# Patient Record
Sex: Male | Born: 1990 | Race: White | Hispanic: No | Marital: Single | Smoking: Never smoker
Health system: Southern US, Community
[De-identification: ages and names within clinical notes are randomized; demographics above are authoritative.]

## PROBLEM LIST (undated history)

## (undated) DIAGNOSIS — E785 Hyperlipidemia, unspecified: Secondary | ICD-10-CM

## (undated) DIAGNOSIS — L709 Acne, unspecified: Secondary | ICD-10-CM

## (undated) HISTORY — PX: TYMPANOSTOMY TUBE PLACEMENT: SHX32

## (undated) HISTORY — DX: Acne, unspecified: L70.9

## (undated) HISTORY — PX: TONSILLECTOMY AND ADENOIDECTOMY: SUR1326

---
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
## 2012-02-07 ENCOUNTER — Telehealth: Payer: Self-pay | Admitting: Sports Medicine

## 2012-02-07 DIAGNOSIS — E785 Hyperlipidemia, unspecified: Secondary | ICD-10-CM

## 2012-02-07 NOTE — Telephone Encounter (Signed)
That sounds fantastic. I will go ahead and put in orders for the future blood work. He needs to be fasting for this blood work

## 2012-02-07 NOTE — Telephone Encounter (Signed)
Patient's father called advised that son is in college and only can have appointments on Fridays when he comes home to visit. Father request for patient to have blood work done Friday Nov 1st and he has appointment scheduled Friday Nov 8th at 2:30 wit you. Request to have blood work done a head of time so day of the appointment you can go over the results. Thanks

## 2012-02-27 ENCOUNTER — Institutional Professional Consult (permissible substitution) (INDEPENDENT_AMBULATORY_CARE_PROVIDER_SITE_OTHER): Payer: BC Managed Care – PPO | Admitting: Family

## 2012-02-27 DIAGNOSIS — R279 Unspecified lack of coordination: Secondary | ICD-10-CM

## 2012-02-27 DIAGNOSIS — F909 Attention-deficit hyperactivity disorder, unspecified type: Secondary | ICD-10-CM

## 2012-03-02 ENCOUNTER — Institutional Professional Consult (permissible substitution): Payer: BC Managed Care – PPO | Admitting: Family

## 2012-03-23 ENCOUNTER — Other Ambulatory Visit: Payer: Self-pay | Admitting: *Deleted

## 2012-03-23 DIAGNOSIS — E785 Hyperlipidemia, unspecified: Secondary | ICD-10-CM

## 2012-03-24 LAB — LIPID PANEL
Cholesterol: 200 mg/dL (ref 0–200)
HDL: 53 mg/dL (ref >39)
LDL Cholesterol: 118 mg/dL — ABNORMAL HIGH (ref 0–99)
Total CHOL/HDL Ratio: 3.8 Ratio
Triglycerides: 144 mg/dL (ref <150)
VLDL: 29 mg/dL (ref 0–40)

## 2012-03-30 ENCOUNTER — Ambulatory Visit: Payer: BC Managed Care – PPO | Admitting: Sports Medicine

## 2012-04-06 ENCOUNTER — Encounter: Payer: Self-pay | Admitting: Sports Medicine

## 2012-04-06 ENCOUNTER — Ambulatory Visit (INDEPENDENT_AMBULATORY_CARE_PROVIDER_SITE_OTHER): Payer: BC Managed Care – PPO | Admitting: Sports Medicine

## 2012-04-06 VITALS — BP 119/69 | HR 60 | Resp 18 | Ht 71.0 in | Wt 223.0 lb

## 2012-04-06 DIAGNOSIS — Z23 Encounter for immunization: Secondary | ICD-10-CM

## 2012-04-06 DIAGNOSIS — Z0000 Encounter for general adult medical examination without abnormal findings: Secondary | ICD-10-CM | POA: Insufficient documentation

## 2012-04-06 DIAGNOSIS — E785 Hyperlipidemia, unspecified: Secondary | ICD-10-CM

## 2012-04-06 DIAGNOSIS — Z299 Encounter for prophylactic measures, unspecified: Secondary | ICD-10-CM

## 2012-04-06 MED ORDER — RED YEAST RICE 600 MG PO CAPS: 1.0000 | ORAL_CAPSULE | Freq: Three times a day (TID) | ORAL | Status: DC

## 2012-04-06 NOTE — Assessment & Plan Note (Signed)
Close to goal. We discussed trying herbal red rice yeast extract. We can recheck his lipids in 6-12 weeks.

## 2012-04-06 NOTE — Patient Instructions (Addendum)
Exercise prescription:  You should adjust the intensity of your exercise based on your heart rate. The American College sports medicine recommends keeping your heart rate between 70-80% of its maximum for 30 minutes, 3-5 times per week. Maximum heart rate = (220 - age). Multiply this number by 0.75 to get your goal heart rate. If lower, then increase the intensity of your exercise. If the number is higher, you may decrease the intensity of your exercise.  Fat and Cholesterol Control Diet Cholesterol levels in your body are determined significantly by your diet. Cholesterol levels may also be related to heart disease. The following material helps to explain this relationship and discusses what you can do to help keep your heart healthy. Not all cholesterol is bad. Low-density lipoprotein (LDL) cholesterol is the "bad" cholesterol. It may cause fatty deposits to build up inside your arteries. High-density lipoprotein (HDL) cholesterol is "good." It helps to remove the "bad" LDL cholesterol from your blood. Cholesterol is a very important risk factor for heart disease. Other risk factors are high blood pressure, smoking, stress, heredity, and weight. The heart muscle gets its supply of blood through the coronary arteries. If your LDL cholesterol is high and your HDL cholesterol is low, you are at risk for having fatty deposits build up in your coronary arteries. This leaves less room through which blood can flow. Without sufficient blood and oxygen, the heart muscle cannot function properly and you may feel chest pains (angina pectoris). When a coronary artery closes up entirely, a part of the heart muscle may die causing a heart attack (myocardial infarction). CHECKING CHOLESTEROL When your caregiver sends your blood to a lab to be examined for cholesterol, a complete lipid (fat) profile may be done. With this test, the total amount of cholesterol and levels of LDL and HDL are determined. Triglycerides  are a type of fat that circulates in the blood. They can also be used to determine heart disease risk. The list below describes what the numbers should be: Test: Total Cholesterol.  Less than 200 mg/dl. Test: LDL "bad cholesterol."  Less than 100 mg/dl.  Less than 70 mg/dl if you are at very high risk of a heart attack or sudden cardiac death. Test: HDL "good cholesterol."  Greater than 50 mg/dl for women.  Greater than 40 mg/dl for men. Test: Triglycerides.  Less than 150 mg/dl. CONTROLLING CHOLESTEROL WITH DIET Although exercise and lifestyle factors are important, your diet is key. That is because certain foods are known to raise cholesterol and others to lower it. The goal is to balance foods for their effect on cholesterol and more importantly, to replace saturated and trans fat with other types of fat, such as monounsaturated fat, polyunsaturated fat, and omega-3 fatty acids. On average, a person should consume no more than 15 to 17 g of saturated fat daily. Saturated and trans fats are considered "bad" fats, and they will raise LDL cholesterol. Saturated fats are primarily found in animal products such as meats, butter, and cream. However, that does not mean you need to give up all your favorite foods. Today, there are good tasting, low-fat, low-cholesterol substitutes for most of the things you like to eat. Choose low-fat or nonfat alternatives. Choose round or loin cuts of red meat. These types of cuts are lowest in fat and cholesterol. Chicken (without the skin), fish, veal, and ground turkey breast are great choices. Eliminate fatty meats, such as hot dogs and salami. Even shellfish have little or no saturated   fat. Have a 3 oz (85 g) portion when you eat lean meat, poultry, or fish. Trans fats are also called "partially hydrogenated oils." They are oils that have been scientifically manipulated so that they are solid at room temperature resulting in a longer shelf life and improved  taste and texture of foods in which they are added. Trans fats are found in stick margarine, some tub margarines, cookies, crackers, and baked goods.  When baking and cooking, oils are a great substitute for butter. The monounsaturated oils are especially beneficial since it is believed they lower LDL and raise HDL. The oils you should avoid entirely are saturated tropical oils, such as coconut and palm.  Remember to eat a lot from food groups that are naturally free of saturated and trans fat, including fish, fruit, vegetables, beans, grains (barley, rice, couscous, bulgur wheat), and pasta (without cream sauces).  IDENTIFYING FOODS THAT LOWER CHOLESTEROL  Soluble fiber may lower your cholesterol. This type of fiber is found in fruits such as apples, vegetables such as broccoli, potatoes, and carrots, legumes such as beans, peas, and lentils, and grains such as barley. Foods fortified with plant sterols (phytosterol) may also lower cholesterol. You should eat at least 2 g per day of these foods for a cholesterol lowering effect.  Read package labels to identify low-saturated fats, trans fat free, and low-fat foods at the supermarket. Select cheeses that have only 2 to 3 g saturated fat per ounce. Use a heart-healthy tub margarine that is free of trans fats or partially hydrogenated oil. When buying baked goods (cookies, crackers), avoid partially hydrogenated oils. Breads and muffins should be made from whole grains (whole-wheat or whole oat flour, instead of "flour" or "enriched flour"). Buy non-creamy canned soups with reduced salt and no added fats.  FOOD PREPARATION TECHNIQUES  Never deep-fry. If you must fry, either stir-fry, which uses very little fat, or use non-stick cooking sprays. When possible, broil, bake, or roast meats, and steam vegetables. Instead of putting butter or margarine on vegetables, use lemon and herbs, applesauce, and cinnamon (for squash and sweet potatoes), nonfat yogurt, salsa,  and low-fat dressings for salads.  LOW-SATURATED FAT / LOW-FAT FOOD SUBSTITUTES Meats / Saturated Fat (g)  Avoid: Steak, marbled (3 oz/85 g) / 11 g  Choose: Steak, lean (3 oz/85 g) / 4 g  Avoid: Hamburger (3 oz/85 g) / 7 g  Choose: Hamburger, lean (3 oz/85 g) / 5 g  Avoid: Ham (3 oz/85 g) / 6 g  Choose: Ham, lean cut (3 oz/85 g) / 2.4 g  Avoid: Chicken, with skin, dark meat (3 oz/85 g) / 4 g  Choose: Chicken, skin removed, dark meat (3 oz/85 g) / 2 g  Avoid: Chicken, with skin, light meat (3 oz/85 g) / 2.5 g  Choose: Chicken, skin removed, light meat (3 oz/85 g) / 1 g Dairy / Saturated Fat (g)  Avoid: Whole milk (1 cup) / 5 g  Choose: Low-fat milk, 2% (1 cup) / 3 g  Choose: Low-fat milk, 1% (1 cup) / 1.5 g  Choose: Skim milk (1 cup) / 0.3 g  Avoid: Hard cheese (1 oz/28 g) / 6 g  Choose: Skim milk cheese (1 oz/28 g) / 2 to 3 g  Avoid: Cottage cheese, 4% fat (1 cup) / 6.5 g  Choose: Low-fat cottage cheese, 1% fat (1 cup) / 1.5 g  Avoid: Ice cream (1 cup) / 9 g  Choose: Sherbet (1 cup) / 2.5 g  Choose:   Nonfat frozen yogurt (1 cup) / 0.3 g  Choose: Frozen fruit bar / trace  Avoid: Whipped cream (1 tbs) / 3.5 g  Choose: Nondairy whipped topping (1 tbs) / 1 g Condiments / Saturated Fat (g)  Avoid: Mayonnaise (1 tbs) / 2 g  Choose: Low-fat mayonnaise (1 tbs) / 1 g  Avoid: Butter (1 tbs) / 7 g  Choose: Extra light margarine (1 tbs) / 1 g  Avoid: Coconut oil (1 tbs) / 11.8 g  Choose: Olive oil (1 tbs) / 1.8 g  Choose: Corn oil (1 tbs) / 1.7 g  Choose: Safflower oil (1 tbs) / 1.2 g  Choose: Sunflower oil (1 tbs) / 1.4 g  Choose: Soybean oil (1 tbs) / 2.4 g  Choose: Canola oil (1 tbs) / 1 g Document Released: 05/09/2005 Document Revised: 08/01/2011 Document Reviewed: 10/28/2010 [employer] Patient Information 2013 [employer].  

## 2012-04-06 NOTE — Assessment & Plan Note (Signed)
Has gained 20 pounds since starting college. Discussed low-cholesterol, dieting strategies. Giving Tdap and flu shots today.

## 2012-04-06 NOTE — Progress Notes (Signed)
Subjective:    CC: Followup  HPI: Hyperlipidemia: Jerome cholesterol is marginally high. We have been working on some methods to get his lipids down. He would desire to do more natural route, possibly using herbal supplements rather than starting pharmacologic intervention I think this is appropriate.  Preventive measures: Due for tetanus and flu shot. He has also gained 20 pounds so far his freshman year in college. He is looking for ways to cut back, and diet.  Past medical history, Surgical history, Family history, Social history, Allergies, and medications have been entered into the medical record, reviewed, and no changes needed.   Review of Systems: No fevers, chills, night sweats, weight loss, chest pain, or shortness of breath.   Objective:    General: Well Developed, well nourished, and in no acute distress.  Neuro: Alert and oriented x3, extra-ocular muscles intact.  HEENT: Normocephalic, atraumatic, pupils equal round reactive to light, neck supple, no masses, no lymphadenopathy, thyroid nonpalpable.  Skin: Warm and dry, no rashes. Cardiac: Regular rate and rhythm, no murmurs rubs or gallops.  Respiratory: Clear to auscultation bilaterally. Not using accessory muscles, speaking in full sentences.   Impression and Recommendations:

## 2012-05-17 LAB — LIPID PANEL
Cholesterol: 257 mg/dL — ABNORMAL HIGH (ref 0–200)
HDL: 52 mg/dL (ref >39)
LDL Cholesterol: 172 mg/dL — ABNORMAL HIGH (ref 0–99)
Total CHOL/HDL Ratio: 4.9 Ratio
Triglycerides: 163 mg/dL — ABNORMAL HIGH (ref <150)
VLDL: 33 mg/dL (ref 0–40)

## 2012-05-18 ENCOUNTER — Other Ambulatory Visit: Payer: Self-pay | Admitting: Sports Medicine

## 2012-05-18 DIAGNOSIS — E785 Hyperlipidemia, unspecified: Secondary | ICD-10-CM

## 2012-05-18 MED ORDER — ATORVASTATIN CALCIUM 40 MG PO TABS: 40.0000 mg | ORAL_TABLET | Freq: Every day | ORAL | Status: DC

## 2012-05-18 NOTE — Assessment & Plan Note (Signed)
LDL is greater than 170. Starting Lipitor, recheck in 6 weeks. May discontinue red rice yeast extract.

## 2012-05-22 ENCOUNTER — Telehealth: Payer: Self-pay | Admitting: *Deleted

## 2012-05-22 NOTE — Telephone Encounter (Signed)
Mom called stating that she is concerned with her 21yo son taking Lipitor. States they have spoken with the pharmacy and that they told her that protein shakes can increase the cholesterol. She states that he is doing protein shakes. States the pt's number is [phone] or her number is [phone]. Please advise.

## 2012-05-23 NOTE — Telephone Encounter (Signed)
We have tried dietary modifications and natural methods, cholesterol increased.  Certainly excess calories in any form can increase cholesterol, and that is all a protein shake is in this case, extra calories.  The typical [place] diet itself provides enough protein for lean muscle gains with resistance exercise.  What are her specific concerns regarding lipitor and does she have any concerns regarding his extremely elevated cholesterol levels and strong family history of MI?

## 2012-05-24 MED ORDER — SIMVASTATIN 40 MG PO TABS: 40.0000 mg | ORAL_TABLET | Freq: Every day | ORAL | Status: DC

## 2012-05-24 NOTE — Telephone Encounter (Signed)
Mom states that she is concerned b/c she seen on TV that Lipitor can cause diabetes which also runs in the family. She is asking if we can at least place him on a different statin. Please advise.

## 2012-05-24 NOTE — Telephone Encounter (Signed)
Sigh...ok, will add simvastatin.

## 2012-05-25 NOTE — Telephone Encounter (Signed)
Mom states they will pickup the Zocor and have pt recheck lipids in 6 weeks.

## 2012-06-02 ENCOUNTER — Encounter: Payer: Self-pay | Admitting: *Deleted

## 2012-06-02 ENCOUNTER — Emergency Department (INDEPENDENT_AMBULATORY_CARE_PROVIDER_SITE_OTHER)
Admission: EM | Admit: 2012-06-02 | Discharge: 2012-06-02 | Disposition: A | Discharge status: Home / Self Care | Payer: BC Managed Care – PPO | Source: Home / Self Care | Attending: Family Medicine | Admitting: Family Medicine

## 2012-06-02 DIAGNOSIS — A084 Viral intestinal infection, unspecified: Secondary | ICD-10-CM

## 2012-06-02 DIAGNOSIS — A088 Other specified intestinal infections: Secondary | ICD-10-CM

## 2012-06-02 DIAGNOSIS — R509 Fever, unspecified: Secondary | ICD-10-CM

## 2012-06-02 HISTORY — DX: Hyperlipidemia, unspecified: E78.5

## 2012-06-02 LAB — POCT INFLUENZA A/B
Influenza A, POC: NEGATIVE
Influenza B, POC: NEGATIVE

## 2012-06-02 MED ORDER — ONDANSETRON 4 MG PO TBDP: 4.0000 mg | ORAL_TABLET | Freq: Three times a day (TID) | ORAL | Status: DC | PRN

## 2012-06-02 NOTE — ED Notes (Signed)
Pt c/o abdomen pain, vomiting, diarrhea since 4 AM. Has OTC Tylenol with little relief.

## 2012-06-02 NOTE — ED Provider Notes (Signed)
History     CSN: [id number]  Arrival date & time 06/02/12  1655   First MD Initiated Contact with Patient 06/02/12 1658      Chief Complaint  Patient presents with  . Emesis  . Diarrhea  . Fever   HPI  Nausea, vomiting, diarrhea x 2 days.  Had sudden onset of sxs since last night.  2 episodes of NBNB emesis today.  2 episodes of NBNB diarrhea.  Mild malaise and chills.  tmax 100.8.  Unsure of sick contacts.  Minimal abdominal pain.   Past Medical History  Diagnosis Date  . Acne   . Hyperlipemia     Past Surgical History  Procedure Date  . Tonsillectomy and adenoidectomy   . Tympanostomy tube placement     Family History  Problem Relation Age of Onset  . Hypothyroidism Mother   . Hypertension Father   . Heart disease Father     History  Substance Use Topics  . Smoking status: Never Smoker   . Smokeless tobacco: Never Used  . Alcohol Use: Yes      Review of Systems  All other systems reviewed and are negative.    Allergies  Review of patient's allergies indicates no known allergies.  Home Medications   Current Outpatient Rx  Name  Route  Sig  Dispense  Refill  . AMPICILLIN PO   Oral   Take by mouth.         Marland Kitchen CLINDAMYCIN PHOSPHATE 1 % EX SOLN               . DICLOFENAC SODIUM 75 MG PO TBEC               . METHYLPHENIDATE HCL ER 18 MG PO TBCR   Oral   Take 18 mg by mouth daily as needed.         . METHYLPHENIDATE HCL ER 54 MG PO TBCR   Oral   Take 108 mg by mouth every morning.         Marland Kitchen ONDANSETRON 4 MG PO TBDP   Oral   Take 1 tablet (4 mg total) by mouth every 8 (eight) hours as needed for nausea.   20 tablet   0   . SIMVASTATIN 40 MG PO TABS   Oral   Take 1 tablet (40 mg total) by mouth at bedtime.   90 tablet   3     BP 125/72  Pulse 96  Temp 99.4 F (37.4 C) (Oral)  Resp 16  Ht 5' 10.5" (1.791 m)  Wt 216 lb 8 oz (98.204 kg)  BMI 30.63 kg/m2  SpO2 96%  Physical Exam  Constitutional: He appears  well-developed and well-nourished.  HENT:  Head: Normocephalic and atraumatic.  Eyes: Conjunctivae normal are normal. Pupils are equal, round, and reactive to light.  Neck: Normal range of motion. Neck supple.  Cardiovascular: Normal rate, regular rhythm and normal heart sounds.   Pulmonary/Chest: Effort normal and breath sounds normal.  Abdominal: Soft. Bowel sounds are normal. He exhibits no distension. There is no tenderness. There is no rebound and no guarding.  Musculoskeletal: Normal range of motion.  Neurological: He is alert.  Skin: Skin is warm.    ED Course  Procedures (including critical care time)   Labs Reviewed  POCT INFLUENZA A/B   No results found.   1. Viral gastroenteritis       MDM  Viral gastroenteritis.  Zofran for nausea.  Discussed supportive care and infectious/GI  red flags.  Clinically well hydrated on exam.  Follow up as needed.     The patient and/or caregiver has been counseled thoroughly with regard to treatment plan and/or medications prescribed including dosage, schedule, interactions, rationale for use, and possible side effects and they verbalize understanding. Diagnoses and expected course of recovery discussed and will return if not improved as expected or if the condition worsens. Patient and/or caregiver verbalized understanding.             Doree Albee, MD 06/02/12 1734

## 2012-07-24 ENCOUNTER — Other Ambulatory Visit: Payer: Self-pay | Admitting: *Deleted

## 2012-07-25 LAB — LIPID PANEL
Cholesterol: 151 mg/dL (ref 0–200)
HDL: 43 mg/dL (ref >39)
LDL Cholesterol: 86 mg/dL (ref 0–99)
Total CHOL/HDL Ratio: 3.5 Ratio
Triglycerides: 111 mg/dL (ref <150)
VLDL: 22 mg/dL (ref 0–40)

## 2012-07-31 ENCOUNTER — Ambulatory Visit (INDEPENDENT_AMBULATORY_CARE_PROVIDER_SITE_OTHER): Payer: BC Managed Care – PPO | Admitting: Psychology

## 2012-07-31 DIAGNOSIS — F988 Other specified behavioral and emotional disorders with onset usually occurring in childhood and adolescence: Secondary | ICD-10-CM

## 2012-08-13 ENCOUNTER — Ambulatory Visit (INDEPENDENT_AMBULATORY_CARE_PROVIDER_SITE_OTHER): Payer: BC Managed Care – PPO | Admitting: Psychology

## 2012-08-13 DIAGNOSIS — F988 Other specified behavioral and emotional disorders with onset usually occurring in childhood and adolescence: Secondary | ICD-10-CM

## 2012-10-23 ENCOUNTER — Encounter: Payer: Self-pay | Admitting: Family Medicine

## 2012-10-23 ENCOUNTER — Ambulatory Visit (INDEPENDENT_AMBULATORY_CARE_PROVIDER_SITE_OTHER): Payer: BC Managed Care – PPO | Admitting: Family Medicine

## 2012-10-23 VITALS — BP 129/72 | HR 72 | Temp 97.7°F | Wt 217.0 lb

## 2012-10-23 DIAGNOSIS — H6091 Unspecified otitis externa, right ear: Secondary | ICD-10-CM

## 2012-10-23 DIAGNOSIS — H60339 Swimmer's ear, unspecified ear: Secondary | ICD-10-CM

## 2012-10-23 MED ORDER — CIPROFLOXACIN-HYDROCORTISONE 0.2-1 % OT SUSP: 4.0000 [drp] | Freq: Two times a day (BID) | OTIC | Status: AC

## 2012-10-23 NOTE — Progress Notes (Signed)
CC: Evan Jackson is a 21 y.o. male is here for right ear pain   Subjective: HPI:  Patient complains of right ear pain described as a pressure, moderate in severity, worse when pressing on the ear or cutting down on the lobe. Radiates down the right neck. Has been present for 48 hours worsening on a daily basis. Started 24 hours after he was spending quite a bit of time in their new pool. This happened before when he was younger and feels like a ear infection. They have tried nonmedicated eardrops without much improvement pain. Nothing else makes better or worse. He denies drainage, hearing loss, fevers, chills, dizziness, tinnitus, nasal congestion.  Review Of Systems Outlined In HPI  Past Medical History  Diagnosis Date  . Acne   . Hyperlipemia      Family History  Problem Relation Age of Onset  . Hypothyroidism Mother   . Hypertension Father   . Heart disease Father      History  Substance Use Topics  . Smoking status: Never Smoker   . Smokeless tobacco: Never Used  . Alcohol Use: Yes     Objective: Filed Vitals:   10/23/12 1422  BP: 129/72  Pulse: 72  Temp: 97.7 F (36.5 C)    General: Alert and Oriented, No Acute Distress HEENT: Pupils equal, round, reactive to light. Conjunctivae clear.  Right external canal with moderate edema and mild erythema with a tympanic membrane with mild scarring but good landmarks with a clear middle ear.  Pink inferior turbinates.  Moist mucous membranes, pharynx without inflammation nor lesions.  Neck supple without palpable lymphadenopathy nor abnormal masses. Lungs: Clear to auscultation bilaterally, no wheezing/ronchi/rales.  Comfortable work of breathing. Good air movement.  Mental Status: No depression, anxiety, nor agitation. Skin: Warm and dry.  Assessment & Plan: Evan Jackson was seen today for right ear pain.  Diagnoses and associated orders for this visit:  Otitis externa, swimmer's ear, right - ciprofloxacin-hydrocortisone  (CIPRO HC) otic suspension; Place 4 drops into the right ear 2 (two) times daily. For seven days.    Otitis externa: Will see if formulary coverage will handle Cipro HC, I've asked the family not to spend more than $15 for this, if coverage is poor asked him to call me and I will send him Cortisporin. Consider ear plugs when swimming. Signs and symptoms requring emergent/urgent reevaluation were discussed with the patient. Return if symptoms worsen or fail to improve.

## 2013-03-20 ENCOUNTER — Ambulatory Visit (INDEPENDENT_AMBULATORY_CARE_PROVIDER_SITE_OTHER): Payer: BC Managed Care – PPO | Admitting: Family Medicine

## 2013-03-20 ENCOUNTER — Encounter: Payer: Self-pay | Admitting: Family Medicine

## 2013-03-20 DIAGNOSIS — Z23 Encounter for immunization: Secondary | ICD-10-CM

## 2013-03-20 NOTE — Progress Notes (Signed)
I was present for all necessary aspects of today's encounter. 

## 2013-03-23 ENCOUNTER — Encounter: Payer: Self-pay | Admitting: Emergency Medicine

## 2013-03-23 ENCOUNTER — Emergency Department
Admission: EM | Admit: 2013-03-23 | Discharge: 2013-03-23 | Disposition: A | Discharge status: Home / Self Care | Payer: BC Managed Care – PPO | Source: Home / Self Care | Attending: Family Medicine | Admitting: Family Medicine

## 2013-03-23 DIAGNOSIS — J069 Acute upper respiratory infection, unspecified: Secondary | ICD-10-CM

## 2013-03-23 MED ORDER — BENZONATATE 200 MG PO CAPS: 200.0000 mg | ORAL_CAPSULE | Freq: Every day | ORAL | Status: DC

## 2013-03-23 MED ORDER — PREDNISONE 20 MG PO TABS: 20.0000 mg | ORAL_TABLET | Freq: Two times a day (BID) | ORAL | Status: DC

## 2013-03-23 MED ORDER — AZITHROMYCIN 250 MG PO TABS: ORAL_TABLET | ORAL | Status: DC

## 2013-03-23 NOTE — ED Provider Notes (Signed)
CSN: [id number]     Arrival date & time 03/23/13  1731 History   First MD Initiated Contact with Patient 03/23/13 1810     Chief Complaint  Patient presents with  . Cough      HPI Comments: Patient reports that he developed a mild cold-like illness about 3 weeks ago.  The initial scratchy throat and sinus congestion only lasted a few days, but the cough has gradually become worse and non-productive.  He now occasionally coughs until he gags.  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  He denies wheezing or shortness of breath with present illness.    The history is provided by the patient.    Past Medical History  Diagnosis Date  . Acne   . Hyperlipemia    Past Surgical History  Procedure Laterality Date  . Tonsillectomy and adenoidectomy    . Tympanostomy tube placement     Family History  Problem Relation Age of Onset  . Hypothyroidism Mother   . Hypertension Father   . Heart disease Father    History  Substance Use Topics  . Smoking status: Never Smoker   . Smokeless tobacco: Never Used  . Alcohol Use: Yes    Review of Systems + sore throat, resolved + cough No pleuritic pain No wheezing + nasal congestion, resolved No post-nasal drainage No sinus pain/pressure No itchy/red eyes No earache No hemoptysis No SOB No fever/chills No nausea No vomiting No abdominal pain No diarrhea No urinary symptoms No skin rashes No fatigue No myalgias + headache Used OTC meds without relief   Allergies  Review of patient's allergies indicates no known allergies.  Home Medications   Current Outpatient Rx  Name  Route  Sig  Dispense  Refill  . azithromycin (ZITHROMAX Z-PAK) 250 MG tablet      Take 2 tabs today; then begin one tab once daily for 4 more days.   6 each   0   . benzonatate (TESSALON) 200 MG capsule   Oral   Take 1 capsule (200 mg total) by mouth at bedtime.   12 capsule   0   . predniSONE (DELTASONE) 20 MG tablet  Oral   Take 1 tablet (20 mg total) by mouth 2 (two) times daily.   10 tablet   0   . simvastatin (ZOCOR) 40 MG tablet   Oral   Take 1 tablet (40 mg total) by mouth at bedtime.   90 tablet   3    BP 144/62  Pulse 54  Temp(Src) 98.2 F (36.8 C) (Oral)  Ht 5\' 11"  (1.803 m)  Wt 202 lb (91.627 kg)  BMI 28.19 kg/m2  SpO2 98% Physical Exam Nursing notes and Vital Signs reviewed. Appearance:  Patient appears healthy, stated age, and in no acute distress Eyes:  Pupils are equal, round, and reactive to light and accomodation.  Extraocular movement is intact.  Conjunctivae are not inflamed  Ears:  Canals normal.  Tympanic membranes normal.  Nose:  Mildly congested turbinates.  No sinus tenderness.   Pharynx:  Normal Neck:  Supple.   No adenopathy Lungs:  Clear to auscultation.  Breath sounds are equal.  Heart:  Regular rate and rhythm without murmurs, rubs, or gallops.  Abdomen:  Nontender without masses or hepatosplenomegaly.  Bowel sounds are present.  No CVA or flank tenderness.  Extremities:  No edema.  No calf tenderness Skin:  No rash present.   ED Course  Procedures  none       MDM   1. Acute upper respiratory infections of unspecified site.  ?viral URI with post-infectious cough.  ?pertussis or atypical agent    Will cover for pertussis and atypicals with Z-pack.  Prescription written for Benzonatate [facility]) to take at bedtime for night-time cough.  With a history of exercise asthma, will add prednisone burst to minimize post-infectious cough. Take plain Mucinex (guaifenesin) twice daily for cough and congestion.  Increase fluid intake, rest. Stop all antihistamines for now, and other non-prescription cough/cold preparations. Followup with Family Doctor if not improved in one week.     Lattie Haw, MD 03/25/13 [phone]

## 2013-03-23 NOTE — ED Notes (Addendum)
Patient c/o cough x 3 wks on and off. Patient states it is not a productive cough as of right now and states "cough comes only in the evenings and early mornings." Has not tried anything OTC.

## 2013-03-25 ENCOUNTER — Telehealth: Payer: Self-pay | Admitting: Emergency Medicine

## 2013-10-21 ENCOUNTER — Ambulatory Visit (INDEPENDENT_AMBULATORY_CARE_PROVIDER_SITE_OTHER): Payer: BC Managed Care – PPO | Admitting: Sports Medicine

## 2013-10-21 ENCOUNTER — Ambulatory Visit (INDEPENDENT_AMBULATORY_CARE_PROVIDER_SITE_OTHER): Payer: BC Managed Care – PPO

## 2013-10-21 ENCOUNTER — Encounter: Payer: Self-pay | Admitting: Sports Medicine

## 2013-10-21 VITALS — BP 144/82 | HR 60 | Wt 192.0 lb

## 2013-10-21 DIAGNOSIS — S62113A Displaced fracture of triquetrum [cuneiform] bone, unspecified wrist, initial encounter for closed fracture: Secondary | ICD-10-CM

## 2013-10-21 DIAGNOSIS — M25539 Pain in unspecified wrist: Secondary | ICD-10-CM

## 2013-10-21 DIAGNOSIS — M25531 Pain in right wrist: Secondary | ICD-10-CM

## 2013-10-21 DIAGNOSIS — W19XXXA Unspecified fall, initial encounter: Secondary | ICD-10-CM

## 2013-10-21 DIAGNOSIS — S63591A Other specified sprain of right wrist, initial encounter: Secondary | ICD-10-CM | POA: Insufficient documentation

## 2013-10-21 MED ORDER — MELOXICAM 15 MG PO TABS: ORAL_TABLET | ORAL | Status: DC

## 2013-10-21 NOTE — Progress Notes (Signed)
  Subjective:    CC: Wrist injury  HPI: This is a pleasant 23 year old male, yesterday he fell onto an outstretched hand had immediate pain in the radiocarpal joint. He denies any snuffbox pain, he iced it immediately but never really had any bruising or swelling. He is moderate, persistent.  Past medical history, Surgical history, Family history not pertinant except as noted below, Social history, Allergies, and medications have been entered into the medical record, reviewed, and no changes needed.   Review of Systems: No fevers, chills, night sweats, weight loss, chest pain, or shortness of breath.   Objective:    General: Well Developed, well nourished, and in no acute distress.  Neuro: Alert and oriented x3, extra-ocular muscles intact, sensation grossly intact.  HEENT: Normocephalic, atraumatic, pupils equal round reactive to light, neck supple, no masses, no lymphadenopathy, thyroid nonpalpable.  Skin: Warm and dry, no rashes. Cardiac: Regular rate and rhythm, no murmurs rubs or gallops, no lower extremity edema.  Respiratory: Clear to auscultation bilaterally. Not using accessory muscles, speaking in full sentences. Right Wrist: Inspection normal with no visible erythema or swelling. Tender to palpation of the radial carpal joint, there is reproduction of pain with flexion and extension but no instability. ROM smooth and normal with good flexion and extension and ulnar/radial deviation that is symmetrical with opposite wrist. Palpation is normal over metacarpals, navicular, lunate, and TFCC; tendons without tenderness/ swelling No snuffbox tenderness. No tenderness over Canal of [place]. Strength 5/5 in all directions without pain. Negative Finkelstein, tinel's and phalens. Negative Watson's test.  Impression and Recommendations:

## 2013-10-21 NOTE — Assessment & Plan Note (Signed)
Quite surprisingly x-rays do show a nondisplaced fracture of the triquetrum. I like to see him back tomorrow to place a short arm cast.

## 2013-10-21 NOTE — Assessment & Plan Note (Signed)
After a fall onto an outstretched hand yesterday. This likely represents a radiocarpal sprain. There is no snuff box pain. X-rays, continue wrist brace, Mobic. Hold rehabilitation exercises. Left-handed work only. Return in 2 weeks.

## 2013-10-23 ENCOUNTER — Ambulatory Visit: Payer: BC Managed Care – PPO | Admitting: Sports Medicine

## 2013-10-23 ENCOUNTER — Encounter: Payer: Self-pay | Admitting: *Deleted

## 2013-10-24 ENCOUNTER — Encounter: Payer: Self-pay | Admitting: Sports Medicine

## 2013-10-24 ENCOUNTER — Ambulatory Visit (INDEPENDENT_AMBULATORY_CARE_PROVIDER_SITE_OTHER): Payer: BC Managed Care – PPO | Admitting: Sports Medicine

## 2013-10-24 VITALS — BP 139/86 | HR 65 | Wt 160.0 lb

## 2013-10-24 DIAGNOSIS — S62111A Displaced fracture of triquetrum [cuneiform] bone, right wrist, initial encounter for closed fracture: Secondary | ICD-10-CM

## 2013-10-24 DIAGNOSIS — S62113A Displaced fracture of triquetrum [cuneiform] bone, unspecified wrist, initial encounter for closed fracture: Secondary | ICD-10-CM

## 2013-10-24 NOTE — Assessment & Plan Note (Addendum)
Short arm cast placed. Return in a month, if still painful the fracture site we will place him back and we'll go progress. Suspect 4- 6 weeks for total healing.  I billed a fracture code for this visit, all subsequent visits for this complaint will be "post-op checks" in the global period.

## 2013-10-24 NOTE — Progress Notes (Signed)
  Subjective:    CC: Recheck fracture  HPI: Several days ago this pleasant 23 year old male fell into an outstretched right hand, immediate pain, swelling, no bruising. X-rays were obtained and did show a fracture through the triquetrum. He returns for further evaluation and definitive treatment. Pain is mild and improving.  Past medical history, Surgical history, Family history not pertinant except as noted below, Social history, Allergies, and medications have been entered into the medical record, reviewed, and no changes needed.   Review of Systems: No fevers, chills, night sweats, weight loss, chest pain, or shortness of breath.   Objective:    General: Well Developed, well nourished, and in no acute distress.  Neuro: Alert and oriented x3, extra-ocular muscles intact, sensation grossly intact.  HEENT: Normocephalic, atraumatic, pupils equal round reactive to light, neck supple, no masses, no lymphadenopathy, thyroid nonpalpable.  Skin: Warm and dry, no rashes. Cardiac: Regular rate and rhythm, no murmurs rubs or gallops, no lower extremity edema.  Respiratory: Clear to auscultation bilaterally. Not using accessory muscles, speaking in full sentences. Right wrist: Tender to palpation over the triquetrum, no bruising, no swelling.  X-rays reviewed and show a nondisplaced fracture through the triquetrum.  Short arm cast placed.  Impression and Recommendations:

## 2013-11-04 ENCOUNTER — Ambulatory Visit: Payer: BC Managed Care – PPO | Admitting: Sports Medicine

## 2013-11-21 ENCOUNTER — Ambulatory Visit (INDEPENDENT_AMBULATORY_CARE_PROVIDER_SITE_OTHER): Payer: BC Managed Care – PPO | Admitting: Sports Medicine

## 2013-11-21 VITALS — BP 130/72 | HR 59 | Wt 194.0 lb

## 2013-11-21 DIAGNOSIS — S5290XD Unspecified fracture of unspecified forearm, subsequent encounter for closed fracture with routine healing: Secondary | ICD-10-CM

## 2013-11-21 DIAGNOSIS — S62111D Displaced fracture of triquetrum [cuneiform] bone, right wrist, subsequent encounter for fracture with routine healing: Secondary | ICD-10-CM

## 2013-11-21 NOTE — Progress Notes (Signed)
  Subjective: 4 weeks post fracture of the right triquetrum, Sha short arm cast for a month, currently pain-free.   Objective: General: Well-developed, well-nourished, and in no acute distress. Right Wrist: Inspection normal with no visible erythema or swelling. ROM smooth and normal with good flexion and extension and ulnar/radial deviation that is symmetrical with opposite wrist. Palpation is normal over metacarpals, navicular, lunate, and TFCC; tendons without tenderness/ swelling No snuffbox tenderness. No tenderness over Canal of [place]. Strength 5/5 in all directions without pain. Negative Finkelstein, tinel's and phalens. Negative Watson's test.  Assessment/plan:

## 2013-11-21 NOTE — Assessment & Plan Note (Signed)
Clinically resolved, return as needed. 

## 2014-02-20 ENCOUNTER — Ambulatory Visit (INDEPENDENT_AMBULATORY_CARE_PROVIDER_SITE_OTHER): Payer: BC Managed Care – PPO | Admitting: Sports Medicine

## 2014-02-20 VITALS — Temp 98.4°F

## 2014-02-20 DIAGNOSIS — Z23 Encounter for immunization: Secondary | ICD-10-CM | POA: Diagnosis not present

## 2014-02-20 DIAGNOSIS — Z0000 Encounter for general adult medical examination without abnormal findings: Secondary | ICD-10-CM

## 2014-02-20 NOTE — Progress Notes (Signed)
   Subjective:    Patient ID: Evan Jackson, male    DOB: 1990/09/23, 23 y.o.   MRN: [id number]  HPI Evan Jackson reports today for flu shot which he rec'd without complication. He denies any recent sickness, fever, headache or flu like symptoms. He was counseled on side effects of immunization. Corliss SkainsJamie Florie Carico, CMA    Review of Systems     Objective:   Physical Exam        Assessment & Plan:

## 2014-02-20 NOTE — Assessment & Plan Note (Signed)
Influenza vaccine as above. 

## 2014-07-29 ENCOUNTER — Ambulatory Visit (INDEPENDENT_AMBULATORY_CARE_PROVIDER_SITE_OTHER): Payer: BC Managed Care – PPO

## 2014-07-29 ENCOUNTER — Encounter: Payer: Self-pay | Admitting: Sports Medicine

## 2014-07-29 ENCOUNTER — Ambulatory Visit (INDEPENDENT_AMBULATORY_CARE_PROVIDER_SITE_OTHER): Payer: BC Managed Care – PPO | Admitting: Sports Medicine

## 2014-07-29 VITALS — BP 131/75 | HR 71 | Wt 226.0 lb

## 2014-07-29 DIAGNOSIS — J029 Acute pharyngitis, unspecified: Secondary | ICD-10-CM | POA: Diagnosis not present

## 2014-07-29 DIAGNOSIS — M25531 Pain in right wrist: Secondary | ICD-10-CM | POA: Diagnosis not present

## 2014-07-29 LAB — POCT RAPID STREP A (OFFICE): Rapid Strep A Screen: NEGATIVE

## 2014-07-29 NOTE — Progress Notes (Signed)
  Subjective:    CC: Right wrist pain  HPI: Evan Jackson returns, he had a fracture through the triquetrum last year, prior to that he had what appeared to be a distal radius fracture, treated by another provider. He initially did well, he has not been working out through the winter but more recently has started a new workout regimen. He does tell me he is getting worsening pain in the dorsum of the radiocarpal joint, worse with any pressure on the wrist, but predominantly with his hands flat during pushups. Pain is moderate, persistent with occasional mechanical symptoms.  Sore throat: Present for a few days, no fever, minimal sore throat, no cough, no GI symptoms, no rash. Minimal rhinorrhea, and no sinus pressure.  Past medical history, Surgical history, Family history not pertinant except as noted below, Social history, Allergies, and medications have been entered into the medical record, reviewed, and no changes needed.   Review of Systems: No fevers, chills, night sweats, weight loss, chest pain, or shortness of breath.   Objective:    General: Well Developed, well nourished, and in no acute distress.  Neuro: Alert and oriented x3, extra-ocular muscles intact, sensation grossly intact.  HEENT: Normocephalic, atraumatic, pupils equal round reactive to light, neck supple, no masses,thyroid nonpalpable. Oropharynx shows erythematous, and mildly swollen tonsils with trace exudates, mild right cervical lymphadenopathy, nasopharynx and ear canals are unremarkable. Skin: Warm and dry, no rashes. Cardiac: Regular rate and rhythm, no murmurs rubs or gallops, no lower extremity edema.  Respiratory: Clear to auscultation bilaterally. Not using accessory muscles, speaking in full sentences. Right Wrist: Inspection normal with no visible erythema or swelling. ROM smooth and normal with good flexion and extension and ulnar/radial deviation that is symmetrical with opposite wrist. Palpation is normal over  metacarpals, navicular, lunate, and TFCC; tendons without tenderness/ swelling No snuffbox tenderness. No tenderness over Canal of [place]. Strength 5/5 in all directions without pain. Negative Finkelstein, tinel's and phalens. Negative Watson's test.  Rapid strep test is negative.  Impression and Recommendations:

## 2014-07-29 NOTE — Assessment & Plan Note (Signed)
Centor score of 3, awaiting strep swab.

## 2014-07-29 NOTE — Assessment & Plan Note (Signed)
Pain over the dorsum of the radiocarpal joint with extreme extension when doing pushups. He does have a history of multiple fractures, at this point we do need an x-ray and to proceed with MR arthrogram. He will wear a wrist brace in the meantime.

## 2014-08-04 ENCOUNTER — Ambulatory Visit (INDEPENDENT_AMBULATORY_CARE_PROVIDER_SITE_OTHER): Payer: BC Managed Care – PPO | Admitting: Sports Medicine

## 2014-08-04 ENCOUNTER — Encounter: Payer: Self-pay | Admitting: Sports Medicine

## 2014-08-04 ENCOUNTER — Ambulatory Visit (INDEPENDENT_AMBULATORY_CARE_PROVIDER_SITE_OTHER): Payer: BC Managed Care – PPO

## 2014-08-04 VITALS — BP 131/79 | HR 70 | Ht 72.0 in | Wt 226.0 lb

## 2014-08-04 DIAGNOSIS — M25531 Pain in right wrist: Secondary | ICD-10-CM

## 2014-08-04 DIAGNOSIS — Z8781 Personal history of (healed) traumatic fracture: Secondary | ICD-10-CM

## 2014-08-04 DIAGNOSIS — R936 Abnormal findings on diagnostic imaging of limbs: Secondary | ICD-10-CM

## 2014-08-04 NOTE — Progress Notes (Signed)
  Procedure: Real-time Ultrasound Guided gadolinium contrast injection of right radiocarpal joint Device: GE Logiq E  Verbal informed consent obtained.  Time-out conducted.  Noted no overlying erythema, induration, or other signs of local infection.  Skin prepped in a sterile fashion.  Local anesthesia: Topical Ethyl chloride.  With sterile technique and under real time ultrasound guidance:  25-gauge needle advanced into the joint skimming over the lunate, 1 mL kenalog 40, 2 mL lidocaine injected easily, syringe switched and 0.05 mL dilute gadolinium injected easily, syringe again switched and 1cc of sterile saline injected to flush the needle. Joint visualized and capsule seen distending confirming intra-articular placement of contrast material and medication. Completed without difficulty  Advised to call if fevers/chills, erythema, induration, drainage, or persistent bleeding.  Images permanently stored and available for review in the ultrasound unit.  Impression: Technically successful ultrasound guided gadolinium contrast injection for MR arthrography.  Please see separate MR arthrogram report.

## 2014-08-04 NOTE — Assessment & Plan Note (Signed)
MR arthrogram as above. Patient will follow up for MRI results.

## 2014-08-07 ENCOUNTER — Other Ambulatory Visit: Payer: BC Managed Care – PPO

## 2014-08-07 ENCOUNTER — Ambulatory Visit: Payer: BC Managed Care – PPO | Admitting: Sports Medicine

## 2014-08-11 ENCOUNTER — Encounter: Payer: Self-pay | Admitting: Sports Medicine

## 2014-08-11 ENCOUNTER — Ambulatory Visit (INDEPENDENT_AMBULATORY_CARE_PROVIDER_SITE_OTHER): Payer: BC Managed Care – PPO | Admitting: Sports Medicine

## 2014-08-11 VITALS — BP 116/73 | HR 66 | Ht 72.0 in | Wt 231.0 lb

## 2014-08-11 DIAGNOSIS — S63591D Other specified sprain of right wrist, subsequent encounter: Secondary | ICD-10-CM

## 2014-08-11 NOTE — Progress Notes (Signed)
  Subjective:    CC: MRI results  HPI: Evan Jackson returns, he has had persistent pain that he localizes over the dorsum of his distal radius, worse with extension of the wrist as in doing pushups. He significantly does feel better with doing pushups on a pushup bar. He also has a history of a triquetral fracture that has healed. We did an injection for an MR arthrogram at the last visit and he is here to follow up results, tells me pain is approximately the same since his injection.  Past medical history, Surgical history, Family history not pertinant except as noted below, Social history, Allergies, and medications have been entered into the medical record, reviewed, and no changes needed.   Review of Systems: No fevers, chills, night sweats, weight loss, chest pain, or shortness of breath.   Objective:    General: Well Developed, well nourished, and in no acute distress.  Neuro: Alert and oriented x3, extra-ocular muscles intact, sensation grossly intact.  HEENT: Normocephalic, atraumatic, pupils equal round reactive to light, neck supple, no masses, no lymphadenopathy, thyroid nonpalpable.  Skin: Warm and dry, no rashes. Cardiac: Regular rate and rhythm, no murmurs rubs or gallops, no lower extremity edema.  Respiratory: Clear to auscultation bilaterally. Not using accessory muscles, speaking in full sentences. Right Wrist: Inspection normal with no visible erythema or swelling. ROM smooth and normal with good flexion and extension and ulnar/radial deviation that is symmetrical with opposite wrist. Palpation is normal over metacarpals, navicular, lunate, and TFCC; tendons without tenderness/ swelling  Minimal tenderness to palpation at the radiocarpal joint dorsally. No snuffbox tenderness. No tenderness over Canal of [place]. Strength 5/5 in all directions without pain. Negative Finkelstein, tinel's and phalens. Negative Watson's test.  MRI reviewed and shows no leakage of contrast from  the radiocarpal joint into the mid carpal joint, there is a heterogenous appearance of the scapholunate ligament suggesting partial tear. TFCC is unremarkable. No signs of avascular necrosis.  Impression and Recommendations:

## 2014-08-11 NOTE — Assessment & Plan Note (Signed)
Pain is referable predominantly to the dorsum of the radiocarpal joint, MR arthrogram did show a partial tear of the scapholunate ligament. There was no contrast leakage from the radiocarpal joint into the mid carpal joint suggesting overall integrity. At this point we are going to proceed with prolonged immobilization, this time with a thumb spica brace rather than a simple wrist brace. He will work predominantly on cardio in the gym, and return to see me in a month, if persistent symptoms we will refer to hand surgery.

## 2014-09-22 ENCOUNTER — Ambulatory Visit (INDEPENDENT_AMBULATORY_CARE_PROVIDER_SITE_OTHER): Payer: BC Managed Care – PPO | Admitting: Sports Medicine

## 2014-09-22 ENCOUNTER — Encounter: Payer: Self-pay | Admitting: Sports Medicine

## 2014-09-22 VITALS — BP 123/73 | HR 67 | Ht 72.0 in | Wt 221.0 lb

## 2014-09-22 DIAGNOSIS — S63591D Other specified sprain of right wrist, subsequent encounter: Secondary | ICD-10-CM | POA: Diagnosis not present

## 2014-09-22 NOTE — Progress Notes (Signed)
  Subjective:    CC: Follow-up  HPI: Partial scapholunate tear: Now resolved after 6 weeks of thumb spica immobilization.  Past medical history, Surgical history, Family history not pertinant except as noted below, Social history, Allergies, and medications have been entered into the medical record, reviewed, and no changes needed.   Review of Systems: No fevers, chills, night sweats, weight loss, chest pain, or shortness of breath.   Objective:    General: Well Developed, well nourished, and in no acute distress.  Neuro: Alert and oriented x3, extra-ocular muscles intact, sensation grossly intact.  HEENT: Normocephalic, atraumatic, pupils equal round reactive to light, neck supple, no masses, no lymphadenopathy, thyroid nonpalpable.  Skin: Warm and dry, no rashes. Cardiac: Regular rate and rhythm, no murmurs rubs or gallops, no lower extremity edema.  Respiratory: Clear to auscultation bilaterally. Not using accessory muscles, speaking in full sentences. Right Wrist: Inspection normal with no visible erythema or swelling. ROM smooth and normal with good flexion and extension and ulnar/radial deviation that is symmetrical with opposite wrist. Palpation is normal over metacarpals, navicular, lunate, and TFCC; tendons without tenderness/ swelling No snuffbox tenderness. No tenderness over Canal of [place]. Strength 5/5 in all directions without pain. Negative Finkelstein, tinel's and phalens. Negative Watson's test.  Impression and Recommendations:

## 2014-09-22 NOTE — Assessment & Plan Note (Signed)
Resolved after 6 weeks of thumb spica immobilization. Return as needed.

## 2014-10-30 ENCOUNTER — Emergency Department
Admission: EM | Admit: 2014-10-30 | Discharge: 2014-10-30 | Disposition: A | Discharge status: Home / Self Care | Payer: BC Managed Care – PPO | Source: Home / Self Care | Attending: Family Medicine | Admitting: Family Medicine

## 2014-10-30 ENCOUNTER — Encounter: Payer: Self-pay | Admitting: Emergency Medicine

## 2014-10-30 DIAGNOSIS — H6091 Unspecified otitis externa, right ear: Secondary | ICD-10-CM

## 2014-10-30 MED ORDER — AMOXICILLIN 875 MG PO TABS: 875.0000 mg | ORAL_TABLET | Freq: Two times a day (BID) | ORAL | Status: DC

## 2014-10-30 MED ORDER — CIPROFLOXACIN-HYDROCORTISONE 0.2-1 % OT SUSP: 3.0000 [drp] | Freq: Two times a day (BID) | OTIC | Status: DC

## 2014-10-30 NOTE — Discharge Instructions (Signed)
May take Ibuprofen 200mg , 4 tabs every 8 hours with food for pain.  Avoid swimming until well.   Otitis Externa Otitis externa is a bacterial or fungal infection of the outer ear canal. This is the area from the eardrum to the outside of the ear. Otitis externa is sometimes called "swimmer's ear." CAUSES  Possible causes of infection include:  Swimming in dirty water.  Moisture remaining in the ear after swimming or bathing.  Mild injury (trauma) to the ear.  Objects stuck in the ear (foreign body).  Cuts or scrapes (abrasions) on the outside of the ear. SIGNS AND SYMPTOMS  The first symptom of infection is often itching in the ear canal. Later signs and symptoms may include swelling and redness of the ear canal, ear pain, and yellowish-white fluid (pus) coming from the ear. The ear pain may be worse when pulling on the earlobe. DIAGNOSIS  Your health care provider will perform a physical exam. A sample of fluid may be taken from the ear and examined for bacteria or fungi. TREATMENT  Antibiotic ear drops are often given for 10 to 14 days. Treatment may also include pain medicine or corticosteroids to reduce itching and swelling. HOME CARE INSTRUCTIONS   Apply antibiotic ear drops to the ear canal as prescribed by your health care provider.  Take medicines only as directed by your health care provider.  If you have diabetes, follow any additional treatment instructions from your health care provider.  Keep all follow-up visits as directed by your health care provider. PREVENTION   Keep your ear dry. Use the corner of a towel to absorb water out of the ear canal after swimming or bathing.  Avoid scratching or putting objects inside your ear. This can damage the ear canal or remove the protective wax that lines the canal. This makes it easier for bacteria and fungi to grow.  Avoid swimming in lakes, polluted water, or poorly chlorinated pools.  You may use ear drops made of  rubbing alcohol and vinegar after swimming. Combine equal parts of white vinegar and alcohol in a bottle. Put 3 or 4 drops into each ear after swimming. SEEK MEDICAL CARE IF:   You have a fever.  Your ear is still red, swollen, painful, or draining pus after 3 days.  Your redness, swelling, or pain gets worse.  You have a severe headache.  You have redness, swelling, pain, or tenderness in the area behind your ear. MAKE SURE YOU:   Understand these instructions.  Will watch your condition.  Will get help right away if you are not doing well or get worse. Document Released: 05/09/2005 Document Revised: 09/23/2013 Document Reviewed: 05/26/2011 [facility] Patient Information 2015 [place], [place]. This information is not intended to replace advice given to you by your health care provider. Make sure you discuss any questions you have with your health care provider.

## 2014-10-30 NOTE — ED Notes (Signed)
Reports 2 days of right ear pain and pain on that side of neck/throat. Has been alternating ibuprofen and acetaminophen with last dose one hour ago.

## 2014-10-30 NOTE — ED Provider Notes (Signed)
CSN: [id number]     Arrival date & time 10/30/14  1348 History   First MD Initiated Contact with Patient 10/30/14 1455     Chief Complaint  Patient presents with  . Otalgia  . Sore Throat  . Fatigue      HPI Comments: Patient complains of onset of right earache two days ago.  He has had mild sore throat without nasal congestion or cough.  He denies fever/chills, but has felt cold  The history is provided by the patient.    Past Medical History  Diagnosis Date  . Acne   . Hyperlipemia    Past Surgical History  Procedure Laterality Date  . Tonsillectomy and adenoidectomy    . Tympanostomy tube placement     Family History  Problem Relation Age of Onset  . Hypothyroidism Mother   . Hypertension Father   . Heart disease Father    History  Substance Use Topics  . Smoking status: Never Smoker   . Smokeless tobacco: Never Used  . Alcohol Use: Yes    Review of Systems + sore throat ? cough No pleuritic pain No wheezing + nasal congestion ? post-nasal drainage No sinus pain/pressure No itchy/red eyes + right earache No hemoptysis No SOB No fever/chills No nausea No vomiting No abdominal pain No diarrhea No urinary symptoms No skin rash No fatigue No myalgias No headache Used OTC meds without relief  Allergies  Review of patient's allergies indicates no known allergies.  Home Medications   Prior to Admission medications   Medication Sig Start Date End Date Taking? Authorizing Provider  amoxicillin (AMOXIL) 875 MG tablet Take 1 tablet (875 mg total) by mouth 2 (two) times daily. 10/30/14   Lattie Haw, MD  ciprofloxacin-hydrocortisone (CIPRO [facility]) otic suspension Place 3 drops into the right ear 2 (two) times daily. Use for one week. 10/30/14   Lattie Haw, MD   BP 129/69 mmHg  Pulse 100  Temp(Src) 99.6 F (37.6 C) (Oral)  Resp 16  Ht 6' (1.829 m)  Wt 227 lb (102.967 kg)  BMI 30.78 kg/m2  SpO2 98% Physical Exam Nursing notes and Vital Signs  reviewed. Appearance:  Patient appears stated age, and in no acute distress.  Patient is obese (BMI 30.8) Eyes:  Pupils are equal, round, and reactive to light and accomodation.  Extraocular movement is intact.  Conjunctivae are not inflamed  Ears:  Right canal is edematous with mucoid discharge present; unable to visualize right tympanic membrane.  Left canal and tympanic membrane norma.  Nose:  Congested turbinates, more pronounced on the right.  No sinus tenderness.   Pharynx:  Normal Neck:  Supple.  No adenopathy  Lungs:  Clear to auscultation.  Breath sounds are equal.  Heart:  Regular rate and rhythm without murmurs, rubs, or gallops.  Abdomen:  Nontender without masses or hepatosplenomegaly.  Bowel sounds are present.  No CVA or flank tenderness.  Extremities:  No edema.  No calf tenderness Skin:  No rash present.   ED Course  Procedures  none   MDM   1. Otitis externa, right.  Suspect right otitis media also   Begin Cipro HC Otic, and amoxicillin 875mg  BID May take Ibuprofen 200mg , 4 tabs every 8 hours with food for pain.  Avoid swimming until well. Followup with Family Doctor if not improved in one week.     Lattie Haw, MD 11/07/14 [phone]

## 2016-07-12 IMAGING — DX DG WRIST COMPLETE 3+V*R*
4 series · 4 of 4 positions shown · non-contrast
Comparison: 10/21/2013

CLINICAL DATA: Persistent pain radiocarpal joint, history of
trachea from fracture

EXAM:
RIGHT WRIST - COMPLETE 3+ VIEW

[wrist pa]
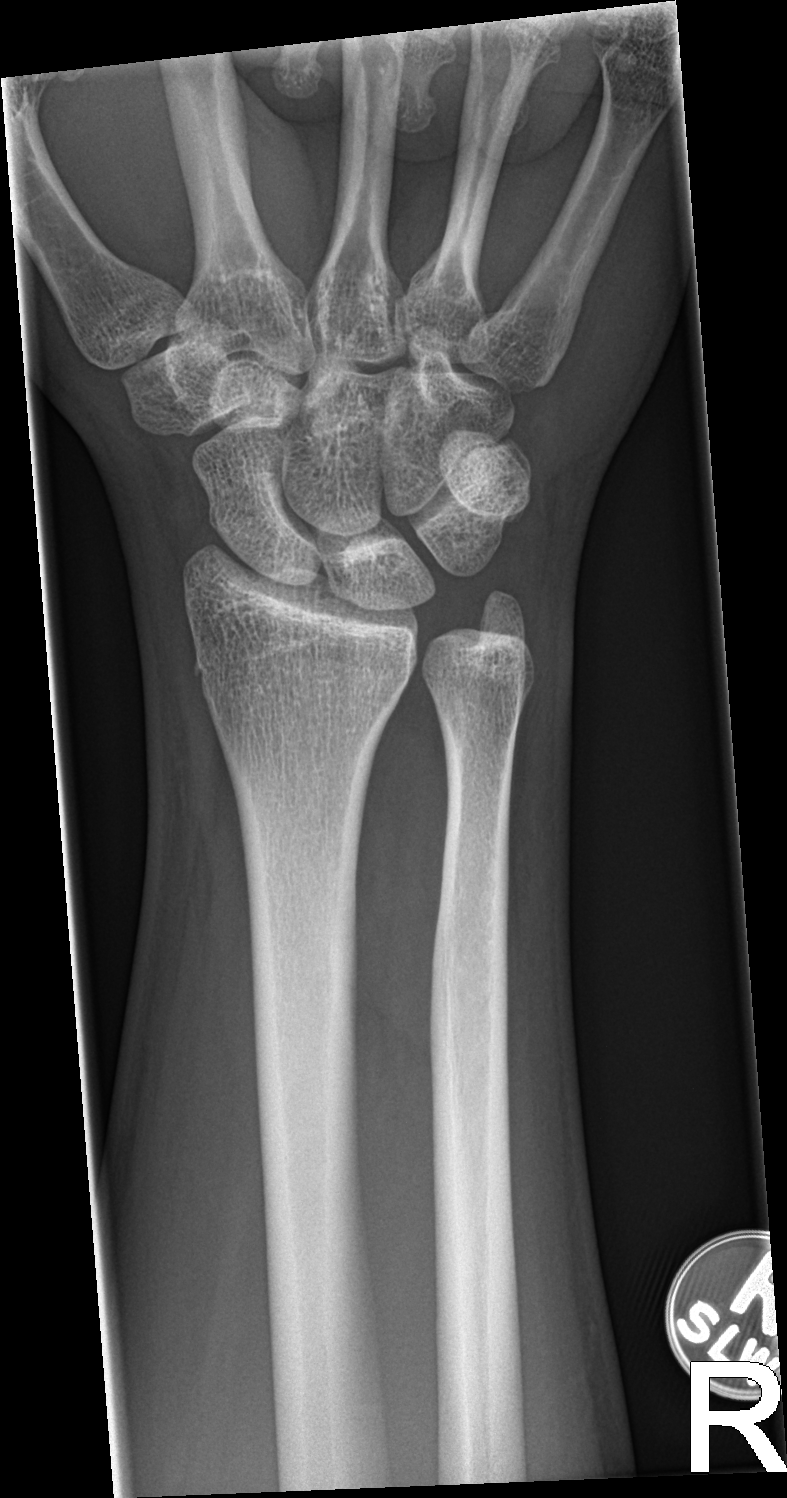

[wrist obl]
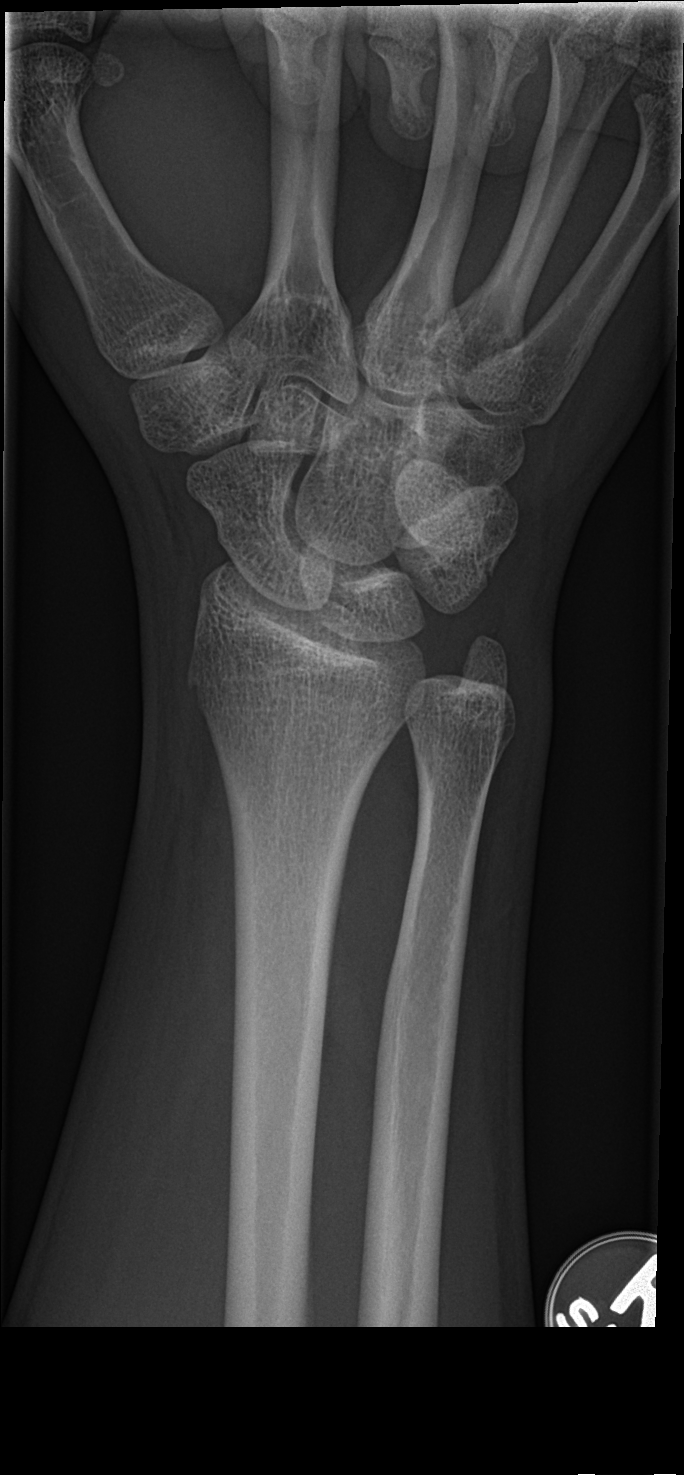

[wrist lat]
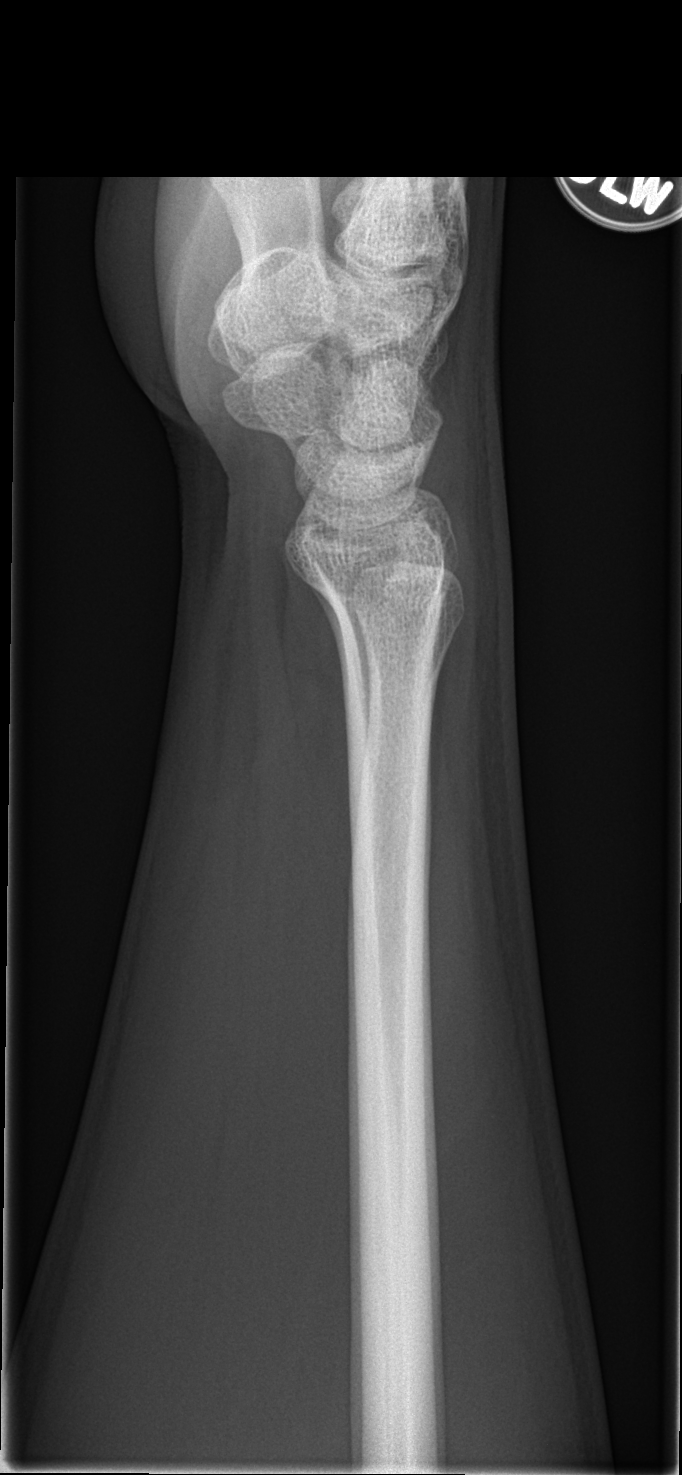

[wrist navicular]
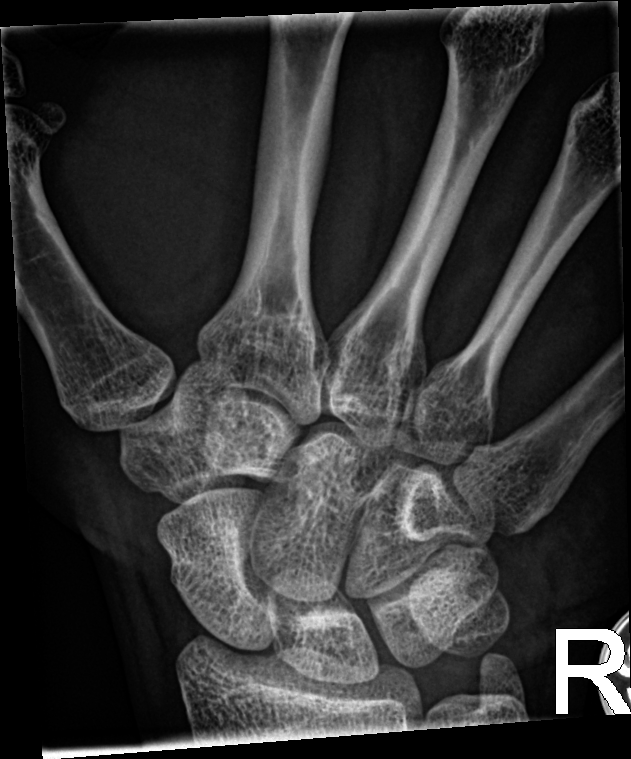

[4 of 4 positions shown; findings below may reference images not displayed]

FINDINGS: Four views of the right wrist submitted. No acute fracture or
subluxation. Previous fracture of the triquetrum is healed. No new
fracture or subluxation. Alignment is preserved.
IMPRESSION: Negative.

## 2017-07-24 ENCOUNTER — Ambulatory Visit (INDEPENDENT_AMBULATORY_CARE_PROVIDER_SITE_OTHER): Payer: BC Managed Care – PPO | Admitting: Sports Medicine

## 2017-07-24 ENCOUNTER — Encounter: Payer: Self-pay | Admitting: Sports Medicine

## 2017-07-24 DIAGNOSIS — Z23 Encounter for immunization: Secondary | ICD-10-CM | POA: Diagnosis not present

## 2017-07-24 DIAGNOSIS — E785 Hyperlipidemia, unspecified: Secondary | ICD-10-CM

## 2017-07-24 DIAGNOSIS — Z0000 Encounter for general adult medical examination without abnormal findings: Secondary | ICD-10-CM

## 2017-07-24 NOTE — Assessment & Plan Note (Addendum)
Routine physical, filled out forms for [employer]. He is up-to-date on all of his vaccinations, adding QuantiFERON gold for TB testing.

## 2017-07-24 NOTE — Assessment & Plan Note (Addendum)
Checking routine labs including lipids.    Cholesterol is extremely high, work on a low cholesterol diet for 3 months, lose about 15 pounds, and then we can recheck.

## 2017-07-24 NOTE — Progress Notes (Addendum)
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  I reviewed the past medical history, family history, social history, surgical history, and allergies today and no changes were needed.  Please see the problem list section below in epic for further details.  Past Medical History: Past Medical History:  Diagnosis Date  . Acne   . Hyperlipemia    Past Surgical History: Past Surgical History:  Procedure Laterality Date  . TONSILLECTOMY AND ADENOIDECTOMY    . TYMPANOSTOMY TUBE PLACEMENT     Social History: Social History   Socioeconomic History  . Marital status: Single    Spouse name: None  . Number of children: None  . Years of education: None  . Highest education level: None  Social Needs  . Financial resource strain: None  . Food insecurity - worry: None  . Food insecurity - inability: None  . Transportation needs - medical: None  . Transportation needs - non-medical: None  Occupational History  . None  Tobacco Use  . Smoking status: Never Smoker  . Smokeless tobacco: Never Used  Substance and Sexual Activity  . Alcohol use: Yes  . Drug use: No  . Sexual activity: No    Birth control/protection: Abstinence  Other Topics Concern  . None  Social History Narrative  . None   Family History: Family History  Problem Relation Age of Onset  . Hypothyroidism Mother   . Hypertension Father   . Heart disease Father    Allergies: No Known Allergies Medications: See med rec.  Review of Systems: No headache, visual changes, nausea, vomiting, diarrhea, constipation, dizziness, abdominal pain, skin rash, fevers, chills, night sweats, swollen lymph nodes, weight loss, chest pain, body aches, joint swelling, muscle aches, shortness of breath, mood changes, visual or  auditory hallucinations.  Objective:    General: Well Developed, well nourished, and in no acute distress.  Neuro: Alert and oriented x3, extra-ocular muscles intact, sensation grossly intact. Cranial nerves II through XII are intact, motor, sensory, and coordinative functions are all intact. HEENT: Normocephalic, atraumatic, pupils equal round reactive to light, neck supple, no masses, no lymphadenopathy, thyroid nonpalpable. Oropharynx, nasopharynx, external ear canals are unremarkable. Skin: Warm and dry, no rashes noted.  Cardiac: Regular rate and rhythm, no murmurs rubs or gallops.  Respiratory: Clear to auscultation bilaterally. Not using accessory muscles, speaking in full sentences.  Abdominal: Soft, nontender, nondistended, positive bowel sounds, no masses, no organomegaly.  Musculoskeletal: Shoulder, elbow, wrist, hip, knee, ankle stable, and with full range of motion.  Impression and Recommendations:    The patient was counselled, risk factors were discussed, anticipatory guidance given.  Annual physical exam Routine physical, filled out forms for [employer]. He is up-to-date on all of his vaccinations, adding QuantiFERON gold for TB testing.  Hyperlipidemia with target low density lipoprotein (LDL) cholesterol less than 100 mg/dL Checking routine labs including lipids.    Cholesterol is extremely high, work on a low cholesterol diet for 3 months, lose about 15 pounds, and then we can recheck.  Morbid obesity (HCC) Cholesterol is extremely high, work on a low cholesterol diet for 3 months, lose about 15 pounds, and then we can recheck. ___________________________________________ Ihor Austinhomas J. Benjamin Stainhekkekandam, M.D., ABFM., CAQSM. Primary Care and Sports Medicine [facility]  Adjunct Instructor of [employer]

## 2017-07-25 LAB — HIV ANTIBODY (ROUTINE TESTING W REFLEX): HIV 1&2 Ab, 4th Generation: NONREACTIVE

## 2017-07-25 NOTE — Assessment & Plan Note (Signed)
Cholesterol is extremely high, work on a low cholesterol diet for 3 months, lose about 15 pounds, and then we can recheck.

## 2017-07-26 LAB — COMPREHENSIVE METABOLIC PANEL
AG Ratio: 1.8 (calc) (ref 1.0–2.5)
ALT: 29 U/L (ref 9–46)
AST: 23 U/L (ref 10–40)
Albumin: 4.8 g/dL (ref 3.6–5.1)
Alkaline phosphatase (APISO): 69 U/L (ref 40–115)
BUN: 17 mg/dL (ref 7–25)
CO2: 25 mmol/L (ref 20–32)
Calcium: 9.7 mg/dL (ref 8.6–10.3)
Chloride: 105 mmol/L (ref 98–110)
Creat: 1.03 mg/dL (ref 0.60–1.35)
Globulin: 2.6 g/dL (calc) (ref 1.9–3.7)
Glucose, Bld: 96 mg/dL (ref 65–99)
Potassium: 4.3 mmol/L (ref 3.5–5.3)
Sodium: 139 mmol/L (ref 135–146)
Total Bilirubin: 0.4 mg/dL (ref 0.2–1.2)
Total Protein: 7.4 g/dL (ref 6.1–8.1)

## 2017-07-26 LAB — CBC
HCT: 43.3 % (ref 38.5–50.0)
Hemoglobin: 14.9 g/dL (ref 13.2–17.1)
MCH: 28.6 pg (ref 27.0–33.0)
MCHC: 34.4 g/dL (ref 32.0–36.0)
MCV: 83.1 fL (ref 80.0–100.0)
MPV: 10.8 fL (ref 7.5–12.5)
Platelets: 243 10*3/uL (ref 140–400)
RBC: 5.21 10*6/uL (ref 4.20–5.80)
RDW: 12.6 % (ref 11.0–15.0)
WBC: 6.4 10*3/uL (ref 3.8–10.8)

## 2017-07-26 LAB — LIPID PANEL W/REFLEX DIRECT LDL
Cholesterol: 237 mg/dL — ABNORMAL HIGH (ref <200)
HDL: 47 mg/dL (ref >40)
LDL Cholesterol (Calc): 159 mg/dL — ABNORMAL HIGH
Non-HDL Cholesterol (Calc): 190 mg/dL — ABNORMAL HIGH (ref <130)
Total CHOL/HDL Ratio: 5 (calc) — ABNORMAL HIGH (ref <5.0)
Triglycerides: 162 mg/dL — ABNORMAL HIGH (ref <150)

## 2017-07-26 LAB — TSH: TSH: 2.2 mIU/L (ref 0.40–4.50)

## 2017-07-26 LAB — QUANTIFERON-TB GOLD PLUS
Mitogen-NIL: >10 IU/mL
NIL: 0.02 IU/mL
QuantiFERON-TB Gold Plus: NEGATIVE
TB1-NIL: 0 IU/mL
TB2-NIL: 0.01 IU/mL

## 2017-07-26 LAB — HEMOGLOBIN A1C
Hgb A1c MFr Bld: 5.4 % of total Hgb (ref <5.7)
Mean Plasma Glucose: 108 (calc)
eAG (mmol/L): 6 (calc)

## 2018-04-18 ENCOUNTER — Encounter: Payer: Self-pay | Admitting: Sports Medicine

## 2018-04-18 ENCOUNTER — Ambulatory Visit (INDEPENDENT_AMBULATORY_CARE_PROVIDER_SITE_OTHER): Payer: BC Managed Care – PPO | Admitting: Sports Medicine

## 2018-04-18 ENCOUNTER — Telehealth: Payer: Self-pay | Admitting: Sports Medicine

## 2018-04-18 VITALS — BP 123/80 | HR 66 | Ht 71.0 in | Wt 259.0 lb

## 2018-04-18 DIAGNOSIS — E785 Hyperlipidemia, unspecified: Secondary | ICD-10-CM

## 2018-04-18 DIAGNOSIS — Z23 Encounter for immunization: Secondary | ICD-10-CM | POA: Diagnosis not present

## 2018-04-18 DIAGNOSIS — F909 Attention-deficit hyperactivity disorder, unspecified type: Secondary | ICD-10-CM

## 2018-04-18 MED ORDER — METHYLPHENIDATE HCL ER (OSM) 36 MG PO TBCR
36.0000 mg | EXTENDED_RELEASE_TABLET | Freq: Every day | ORAL | 0 refills | Status: DC
Start: 2018-04-18 — End: 2018-06-12

## 2018-04-18 NOTE — Assessment & Plan Note (Signed)
It sounds as though he was on high-dose Concerta, we are going to start again but at a lower dose, 36 mg. Monthly dose titrations. He is currently in college [facility] [employer]. He can MyChart me and let me know if it is working in a month.

## 2018-04-18 NOTE — Progress Notes (Signed)
Subjective:    CC: ADHD  HPI:  This is a pleasant 27 year old male, as a child he was diagnosed with ADHD, he was on high-dose Concerta which seemed to work well.  He came off of it, more recently he has been in college, and feels some difficulty concentrating.    Hyperlipidemia: Would like to recheck lipids.  I reviewed the past medical history, family history, social history, surgical history, and allergies today and no changes were needed.  Please see the problem list section below in epic for further details.  Past Medical History: Past Medical History:  Diagnosis Date  . Acne   . Hyperlipemia    Past Surgical History: Past Surgical History:  Procedure Laterality Date  . TONSILLECTOMY AND ADENOIDECTOMY    . TYMPANOSTOMY TUBE PLACEMENT     Social History: Social History   Socioeconomic History  . Marital status: Single    Spouse name: Not on file  . Number of children: Not on file  . Years of education: Not on file  . Highest education level: Not on file  Occupational History  . Not on file  Social Needs  . Financial resource strain: Not on file  . Food insecurity:    Worry: Not on file    Inability: Not on file  . Transportation needs:    Medical: Not on file    Non-medical: Not on file  Tobacco Use  . Smoking status: Never Smoker  . Smokeless tobacco: Never Used  Substance and Sexual Activity  . Alcohol use: Yes  . Drug use: No  . Sexual activity: Never    Birth control/protection: Abstinence  Lifestyle  . Physical activity:    Days per week: Not on file    Minutes per session: Not on file  . Stress: Not on file  Relationships  . Social connections:    Talks on phone: Not on file    Gets together: Not on file    Attends religious service: Not on file    Active member of club or organization: Not on file    Attends meetings of clubs or organizations: Not on file    Relationship status: Not on file  Other Topics Concern  . Not on file  Social  History Narrative  . Not on file   Family History: Family History  Problem Relation Age of Onset  . Hypothyroidism Mother   . Hypertension Father   . Heart disease Father    Allergies: No Known Allergies Medications: See med rec.  Review of Systems: No headache, visual changes, nausea, vomiting, diarrhea, constipation, dizziness, abdominal pain, skin rash, fevers, chills, night sweats, swollen lymph nodes, weight loss, chest pain, body aches, joint swelling, muscle aches, shortness of breath, mood changes, visual or auditory hallucinations.  Objective:    General: Well Developed, well nourished, and in no acute distress.  Neuro: Alert and oriented x3, extra-ocular muscles intact, sensation grossly intact.  HEENT: Normocephalic, atraumatic, pupils equal round reactive to light, neck supple, no masses, no lymphadenopathy, thyroid nonpalpable.  Skin: Warm and dry, no rashes noted.  Cardiac: Regular rate and rhythm, no murmurs rubs or gallops.  Respiratory: Clear to auscultation bilaterally. Not using accessory muscles, speaking in full sentences.  Abdominal: Soft, nontender, nondistended, positive bowel sounds, no masses, no organomegaly.  Musculoskeletal: Shoulder, elbow, wrist, hip, knee, ankle stable, and with full range of motion.  Impression and Recommendations:    The patient was counselled, risk factors were discussed, anticipatory guidance given.  Adult  ADHD It sounds as though he was on high-dose Concerta, we are going to start again but at a lower dose, 36 mg. Monthly dose titrations. He is currently in college at [employer]. He can MyChart me and let me know if it is working in a month.  Hyperlipidemia with target low density lipoprotein (LDL) cholesterol less than 100 mg/dL Rechecking labs.  ___________________________________________ Ihor Austin. Benjamin Stain, M.D., ABFM., CAQSM. Primary Care and Sports Medicine [facility]  Adjunct  Professor of Family Medicine  [employer]

## 2018-04-18 NOTE — Telephone Encounter (Signed)
Received fax from [employer]ymeds that Concerta requires a PA. Information has been sent to the insurance company. Awaiting determination.   

## 2018-04-18 NOTE — Assessment & Plan Note (Signed)
Rechecking labs 

## 2018-04-18 NOTE — Addendum Note (Signed)
Addended by: Donne AnonBENDER, Coda Mathey L on: 04/18/2018 11:58 AM   Modules accepted: Orders

## 2018-04-23 NOTE — Telephone Encounter (Signed)
Received fax from [facility] that Concerta was approved from 04/18/2018 through 04/18/2021. Pharmacy aware and form sent to scan.-hsm.

## 2018-06-12 ENCOUNTER — Other Ambulatory Visit: Payer: Self-pay | Admitting: Sports Medicine

## 2018-06-12 DIAGNOSIS — F909 Attention-deficit hyperactivity disorder, unspecified type: Secondary | ICD-10-CM

## 2018-06-13 MED ORDER — METHYLPHENIDATE HCL ER (OSM) 36 MG PO TBCR: 36.0000 mg | EXTENDED_RELEASE_TABLET | Freq: Every day | ORAL | 0 refills | Status: DC

## 2018-06-13 NOTE — Telephone Encounter (Signed)
Last OV and last RX sent 04/18/18  RX pended, please review

## 2018-07-25 ENCOUNTER — Encounter: Payer: BC Managed Care – PPO | Admitting: Sports Medicine

## 2018-08-08 ENCOUNTER — Other Ambulatory Visit: Payer: Self-pay | Admitting: Sports Medicine

## 2018-08-08 DIAGNOSIS — F909 Attention-deficit hyperactivity disorder, unspecified type: Secondary | ICD-10-CM

## 2018-08-08 MED ORDER — METHYLPHENIDATE HCL ER (OSM) 36 MG PO TBCR: 36.0000 mg | EXTENDED_RELEASE_TABLET | Freq: Every day | ORAL | 0 refills | Status: DC

## 2018-09-12 ENCOUNTER — Other Ambulatory Visit: Payer: Self-pay | Admitting: Sports Medicine

## 2018-09-12 DIAGNOSIS — F909 Attention-deficit hyperactivity disorder, unspecified type: Secondary | ICD-10-CM

## 2018-09-13 MED ORDER — METHYLPHENIDATE HCL ER (OSM) 36 MG PO TBCR: 36.0000 mg | EXTENDED_RELEASE_TABLET | Freq: Every day | ORAL | 0 refills | Status: DC

## 2018-10-23 ENCOUNTER — Other Ambulatory Visit: Payer: Self-pay | Admitting: Sports Medicine

## 2018-10-23 DIAGNOSIS — F909 Attention-deficit hyperactivity disorder, unspecified type: Secondary | ICD-10-CM

## 2018-10-23 MED ORDER — METHYLPHENIDATE HCL ER (OSM) 36 MG PO TBCR: 36.0000 mg | EXTENDED_RELEASE_TABLET | Freq: Every day | ORAL | 0 refills | Status: DC

## 2018-12-02 ENCOUNTER — Other Ambulatory Visit: Payer: Self-pay | Admitting: Sports Medicine

## 2018-12-02 DIAGNOSIS — F909 Attention-deficit hyperactivity disorder, unspecified type: Secondary | ICD-10-CM

## 2018-12-03 MED ORDER — METHYLPHENIDATE HCL ER (OSM) 36 MG PO TBCR: 36.0000 mg | EXTENDED_RELEASE_TABLET | Freq: Every day | ORAL | 0 refills | Status: DC

## 2019-01-23 ENCOUNTER — Other Ambulatory Visit: Payer: Self-pay | Admitting: Sports Medicine

## 2019-01-23 DIAGNOSIS — F909 Attention-deficit hyperactivity disorder, unspecified type: Secondary | ICD-10-CM

## 2019-01-23 MED ORDER — METHYLPHENIDATE HCL ER (OSM) 36 MG PO TBCR: 36.0000 mg | EXTENDED_RELEASE_TABLET | Freq: Every day | ORAL | 0 refills | Status: DC

## 2019-01-29 ENCOUNTER — Encounter: Payer: Self-pay | Admitting: Sports Medicine

## 2019-01-31 ENCOUNTER — Ambulatory Visit (INDEPENDENT_AMBULATORY_CARE_PROVIDER_SITE_OTHER): Payer: BC Managed Care – PPO | Admitting: Sports Medicine

## 2019-01-31 ENCOUNTER — Encounter: Payer: Self-pay | Admitting: Sports Medicine

## 2019-01-31 DIAGNOSIS — F909 Attention-deficit hyperactivity disorder, unspecified type: Secondary | ICD-10-CM

## 2019-01-31 MED ORDER — AMPHETAMINE-DEXTROAMPHET ER 25 MG PO CP24: 25.0000 mg | ORAL_CAPSULE | ORAL | 0 refills | Status: DC

## 2019-01-31 NOTE — Assessment & Plan Note (Signed)
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, he is interested in criminal psychology. Initial good response with 36 mg of Concerta but would like to switch to a different medication. Did not have a good response to Vyvanse but it is unclear whether he gave it a good dose titration. We are going to switch to Adderall XR starting at 25 mg with a monthly up titration to a max of 60 mg if needed.

## 2019-01-31 NOTE — Progress Notes (Signed)
Virtual Visit via WebEx/MyChart   I connected with  Evan Jackson  on 01/31/19 via WebEx/MyChart/Doximity Video and verified that I am speaking with the correct person using two identifiers.   I discussed the limitations, risks, security and privacy concerns of performing an evaluation and management service by WebEx/MyChart/Doximity Video, including the higher likelihood of inaccurate diagnosis and treatment, and the availability of in person appointments.  We also discussed the likely need of an additional face to face encounter for complete and high quality delivery of care.  I also discussed with the patient that there may be a patient responsible charge related to this service. The patient expressed understanding and wishes to proceed.  Provider location is either at home or medical facility. Patient location is at their home, different from provider location. People involved in care of the patient during this telehealth encounter were myself, my nurse/medical assistant, and my front office/scheduling team member.  Subjective:    CC: ADHD  HPI: Evan Jackson is doing well, he is studying [employer] justice [employer].  We have been treating his ADHD with Concerta, he is noting a waning of efficacy.  See below for further details.  I reviewed the past medical history, family history, social history, surgical history, and allergies today and no changes were needed.  Please see the problem list section below in epic for further details.  Past Medical History: Past Medical History:  Diagnosis Date  . Acne   . Hyperlipemia    Past Surgical History: Past Surgical History:  Procedure Laterality Date  . TONSILLECTOMY AND ADENOIDECTOMY    . TYMPANOSTOMY TUBE PLACEMENT     Social History: Social History   Socioeconomic History  . Marital status: Single    Spouse name: Not on file  . Number of children: Not on file  . Years of education: Not on file  . Highest education level: Not  on file  Occupational History  . Not on file  Social Needs  . Financial resource strain: Not on file  . Food insecurity    Worry: Not on file    Inability: Not on file  . Transportation needs    Medical: Not on file    Non-medical: Not on file  Tobacco Use  . Smoking status: Never Smoker  . Smokeless tobacco: Never Used  Substance and Sexual Activity  . Alcohol use: Yes  . Drug use: No  . Sexual activity: Never    Birth control/protection: Abstinence  Lifestyle  . Physical activity    Days per week: Not on file    Minutes per session: Not on file  . Stress: Not on file  Relationships  . Social Musicianconnections    Talks on phone: Not on file    Gets together: Not on file    Attends religious service: Not on file    Active member of club or organization: Not on file    Attends meetings of clubs or organizations: Not on file    Relationship status: Not on file  Other Topics Concern  . Not on file  Social History Narrative  . Not on file   Family History: Family History  Problem Relation Age of Onset  . Hypothyroidism Mother   . Hypertension Father   . Heart disease Father    Allergies: No Known Allergies Medications: See med rec.  Review of Systems: No fevers, chills, night sweats, weight loss, chest pain, or shortness of breath.   Objective:    General:  Speaking full sentences, no audible heavy breathing.  Sounds alert and appropriately interactive.  Appears well.  Face symmetric.  Extraocular movements intact.  Pupils equal and round.  No nasal flaring or accessory muscle use visualized.  No other physical exam performed due to the non-physical nature of this visit.  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 Initial good response with 36 mg of Concerta but would like to switch to a different medication. Did not have a good response to Vyvanse but it is unclear  whether he gave it a good dose titration. We are going to switch to Adderall XR starting at 25 mg with a monthly up titration to a max of 60 mg if needed.   I discussed the above assessment and treatment plan with the patient. The patient was provided an opportunity to ask questions and all were answered. The patient agreed with the plan and demonstrated an understanding of the instructions.   The patient was advised to call back or seek an in-person evaluation if the symptoms worsen or if the condition fails to improve as anticipated.   I provided 25 minutes of non-face-to-face time during this encounter, 15 minutes of additional time was needed to gather information, review chart, records, communicate/coordinate with staff remotely, troubleshooting the multiple errors that we get every time when trying to do video calls through the electronic medical record, WebEx, and Doximity, restart the encounter multiple times due to instability of the software, as well as complete documentation.   ___________________________________________ Gwen Her. Dianah Field, M.D., ABFM., CAQSM. Primary Care and Sports Medicine [facility]  Adjunct Professor of [employer]

## 2019-04-11 ENCOUNTER — Encounter: Payer: Self-pay | Admitting: Sports Medicine

## 2019-04-11 DIAGNOSIS — F909 Attention-deficit hyperactivity disorder, unspecified type: Secondary | ICD-10-CM

## 2019-04-12 MED ORDER — AMPHETAMINE-DEXTROAMPHET ER 20 MG PO CP24: 40.0000 mg | ORAL_CAPSULE | ORAL | 0 refills | Status: DC

## 2019-04-12 NOTE — Assessment & Plan Note (Signed)
Currently studying criminal justice at [employer], interested in clinical psychology. Has not responded sufficiently well to Adderall XR 25 mg, going up to 40 mg. We will revisit this in 1 month.

## 2019-04-15 ENCOUNTER — Telehealth: Payer: Self-pay | Admitting: *Deleted

## 2019-04-15 DIAGNOSIS — F909 Attention-deficit hyperactivity disorder, unspecified type: Secondary | ICD-10-CM

## 2019-04-15 MED ORDER — AMPHETAMINE-DEXTROAMPHET ER 30 MG PO CP24: 30.0000 mg | ORAL_CAPSULE | ORAL | 0 refills | Status: AC

## 2019-04-15 NOTE — Telephone Encounter (Signed)
Yes, that is what I am saying, it comes in a 30 mg pill, this is the largest pill but the maximum daily dose is 60 mg, so to even get to the maximum daily dose one would have to take 2 pills.

## 2019-04-15 NOTE — Telephone Encounter (Signed)
May be have Barnet Pall look into this, maximum dose is 60 mg but largest tablet is 30 mg.  So there is no way to get to the maximum dose without using more than 1 tablet.

## 2019-04-15 NOTE — Telephone Encounter (Signed)
Pt.notified

## 2019-04-15 NOTE — Telephone Encounter (Signed)
Pt's pharmacy called and left vm stating that his insurance will not cover 2 tabs daily of the Adderall 20mg .  They will only cover one tab daily.

## 2019-04-15 NOTE — Telephone Encounter (Signed)
Does this come in a 30mg  pill?

## 2019-04-15 NOTE — Telephone Encounter (Signed)
Yes, let us do 30 mg, sending this in now.  Womp, insurance strikes again.

## 2019-04-15 NOTE — Telephone Encounter (Signed)
Well his ins is not going to pay for him to take 40mg  so do you want to just send in 30mg  instead?
